# Patient Record
Sex: Male | Born: 1935 | Race: White | Hispanic: No | Marital: Married | Smoking: Never smoker
Health system: Southern US, Community
[De-identification: ages and names within clinical notes are randomized; demographics above are authoritative.]

## PROBLEM LIST (undated history)

## (undated) DIAGNOSIS — I519 Heart disease, unspecified: Secondary | ICD-10-CM

## (undated) DIAGNOSIS — I639 Cerebral infarction, unspecified: Secondary | ICD-10-CM

## (undated) DIAGNOSIS — H269 Unspecified cataract: Secondary | ICD-10-CM

## (undated) DIAGNOSIS — I10 Essential (primary) hypertension: Secondary | ICD-10-CM

## (undated) DIAGNOSIS — H3530 Unspecified macular degeneration: Secondary | ICD-10-CM

## (undated) DIAGNOSIS — I219 Acute myocardial infarction, unspecified: Secondary | ICD-10-CM

## (undated) DIAGNOSIS — C61 Malignant neoplasm of prostate: Secondary | ICD-10-CM

## (undated) DIAGNOSIS — M1990 Unspecified osteoarthritis, unspecified site: Secondary | ICD-10-CM

## (undated) DIAGNOSIS — Z96659 Presence of unspecified artificial knee joint: Secondary | ICD-10-CM

## (undated) DIAGNOSIS — I499 Cardiac arrhythmia, unspecified: Secondary | ICD-10-CM

## (undated) DIAGNOSIS — E7800 Pure hypercholesterolemia, unspecified: Secondary | ICD-10-CM

## (undated) HISTORY — PX: EYE SURGERY: SHX253

## (undated) HISTORY — DX: Unspecified osteoarthritis, unspecified site: M19.90

## (undated) HISTORY — DX: Heart disease, unspecified: I51.9

## (undated) HISTORY — DX: Cerebral infarction, unspecified: I63.9

## (undated) HISTORY — DX: Unspecified macular degeneration: H35.30

## (undated) HISTORY — DX: Malignant neoplasm of prostate: C61

## (undated) HISTORY — DX: Presence of unspecified artificial knee joint: Z96.659

## (undated) HISTORY — DX: Cardiac arrhythmia, unspecified: I49.9

## (undated) HISTORY — DX: Unspecified cataract: H26.9

---

## 1998-02-02 HISTORY — PX: CATARACT EXTRACTION: SUR2

## 2010-09-27 ENCOUNTER — Emergency Department (HOSPITAL_COMMUNITY): Payer: Medicare Other

## 2010-09-27 ENCOUNTER — Emergency Department (HOSPITAL_COMMUNITY)
Admission: EM | Admit: 2010-09-27 | Discharge: 2010-09-28 | Disposition: A | Discharge status: Home / Self Care | Payer: Medicare Other | Attending: Emergency Medicine | Admitting: Emergency Medicine

## 2010-09-27 DIAGNOSIS — I4510 Unspecified right bundle-branch block: Secondary | ICD-10-CM | POA: Insufficient documentation

## 2010-09-27 DIAGNOSIS — I2510 Atherosclerotic heart disease of native coronary artery without angina pectoris: Secondary | ICD-10-CM | POA: Insufficient documentation

## 2010-09-27 DIAGNOSIS — E7800 Pure hypercholesterolemia, unspecified: Secondary | ICD-10-CM | POA: Insufficient documentation

## 2010-09-27 DIAGNOSIS — Z951 Presence of aortocoronary bypass graft: Secondary | ICD-10-CM | POA: Insufficient documentation

## 2010-09-27 DIAGNOSIS — J4489 Other specified chronic obstructive pulmonary disease: Secondary | ICD-10-CM | POA: Insufficient documentation

## 2010-09-27 DIAGNOSIS — I252 Old myocardial infarction: Secondary | ICD-10-CM | POA: Insufficient documentation

## 2010-09-27 DIAGNOSIS — R51 Headache: Secondary | ICD-10-CM | POA: Insufficient documentation

## 2010-09-27 DIAGNOSIS — R42 Dizziness and giddiness: Secondary | ICD-10-CM | POA: Insufficient documentation

## 2010-09-27 DIAGNOSIS — J449 Chronic obstructive pulmonary disease, unspecified: Secondary | ICD-10-CM | POA: Insufficient documentation

## 2010-09-27 LAB — DIFFERENTIAL
Basophils Absolute: 0 10*3/uL (ref 0.0–0.1)
Basophils Relative: 0 % (ref 0–1)
Lymphocytes Relative: 16 % (ref 12–46)
Neutro Abs: 5.1 10*3/uL (ref 1.7–7.7)
Neutrophils Relative %: 68 % (ref 43–77)

## 2010-09-27 LAB — CBC
HCT: 42.7 % (ref 39.0–52.0)
Hemoglobin: 14.5 g/dL (ref 13.0–17.0)
RBC: 4.49 MIL/uL (ref 4.22–5.81)
WBC: 7.6 10*3/uL (ref 4.0–10.5)

## 2010-09-27 LAB — CK TOTAL AND CKMB (NOT AT ARMC)
CK, MB: 3.9 ng/mL (ref 0.3–4.0)
Relative Index: INVALID (ref 0.0–2.5)
Total CK: 72 U/L (ref 7–232)

## 2010-09-27 LAB — URINALYSIS, ROUTINE W REFLEX MICROSCOPIC
Glucose, UA: NEGATIVE mg/dL
Hgb urine dipstick: NEGATIVE
Ketones, ur: NEGATIVE mg/dL
Leukocytes, UA: NEGATIVE
pH: 7 (ref 5.0–8.0)

## 2010-09-27 LAB — COMPREHENSIVE METABOLIC PANEL
ALT: 16 U/L (ref 0–53)
AST: 18 U/L (ref 0–37)
Albumin: 3.7 g/dL (ref 3.5–5.2)
Alkaline Phosphatase: 78 U/L (ref 39–117)
BUN: 14 mg/dL (ref 6–23)
Chloride: 105 mEq/L (ref 96–112)
Potassium: 4.7 mEq/L (ref 3.5–5.1)
Sodium: 140 mEq/L (ref 135–145)
Total Protein: 7.2 g/dL (ref 6.0–8.3)

## 2010-09-27 LAB — PROTIME-INR
INR: 1.07 (ref 0.00–1.49)
Prothrombin Time: 14.1 seconds (ref 11.6–15.2)

## 2010-09-27 LAB — GLUCOSE, CAPILLARY: Glucose-Capillary: 98 mg/dL (ref 70–99)

## 2013-04-02 IMAGING — CT CT HEAD W/O CM
1 of 2 series · 16 of 30 positions shown, 20 images · non-contrast
Comparison: None

CLINICAL DATA: Dizziness and confusion

CT HEAD WITHOUT CONTRAST
TECHNIQUE: Contiguous axial images were obtained from the base of
the skull through the vertex without contrast.

[Series 3: recon 2: brain · axial · 0.47mm/px · z∈[+31,+179]mm · 16 of 64 slices shown, 20 images]
[im 4/64  brain]
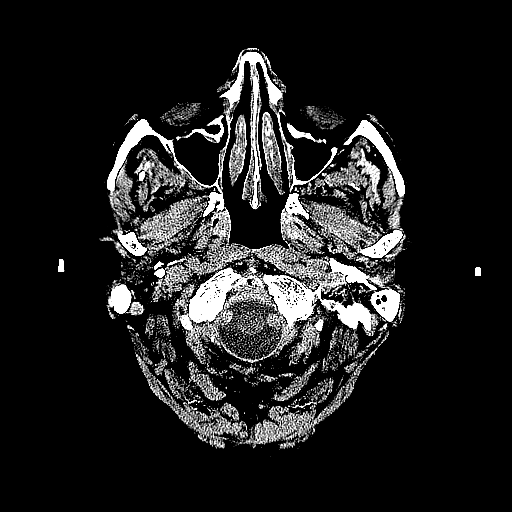
[im 4/64  bone]
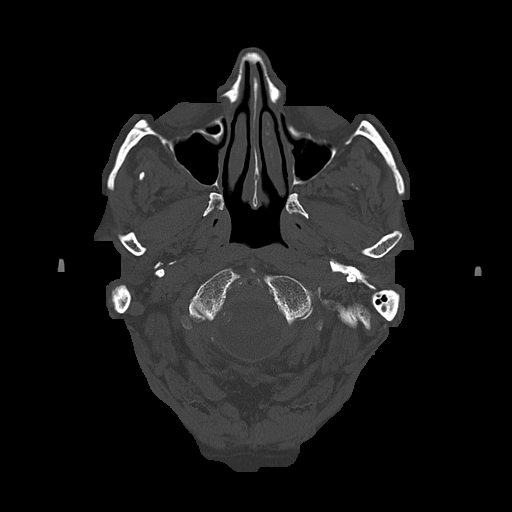
[im 7/64  brain]
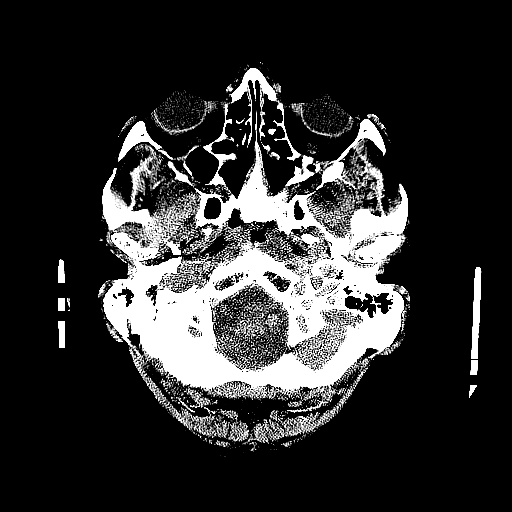
[im 10/64  brain]
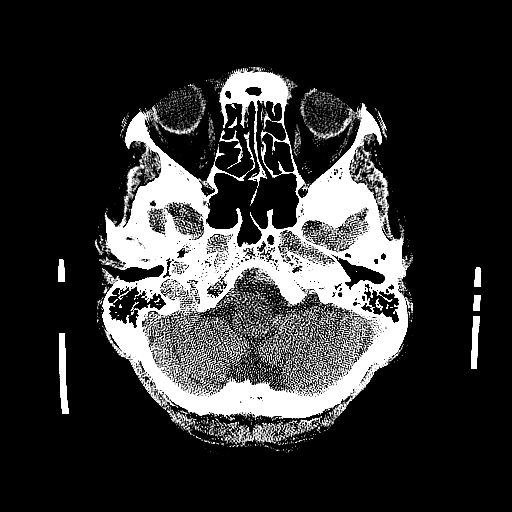
[im 14/64  brain]
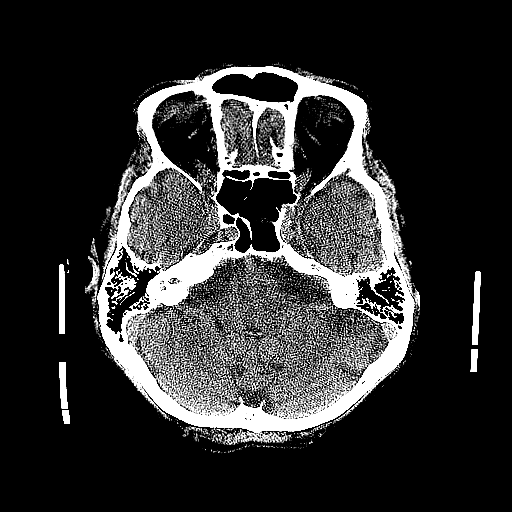
[im 20/64  brain]
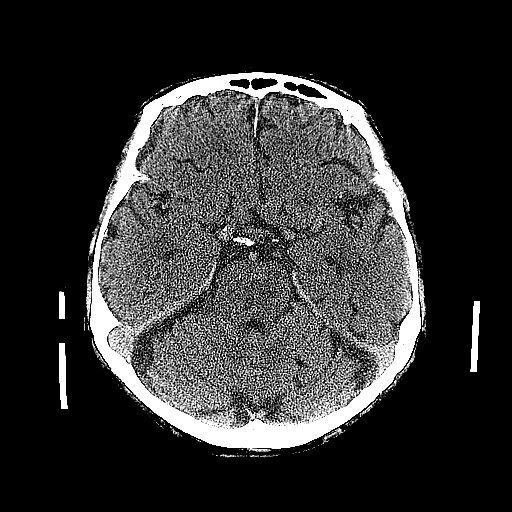
[im 20/64  bone]
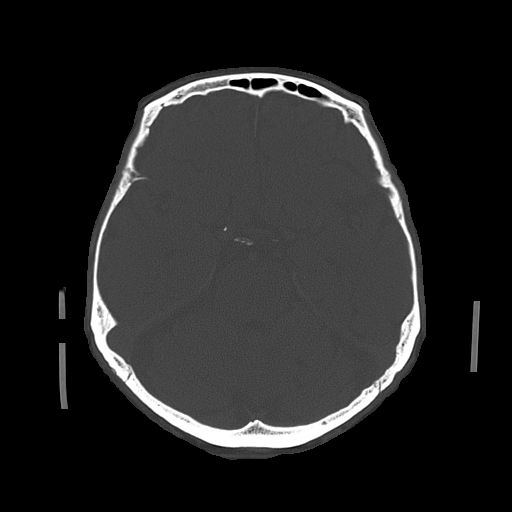
[im 24/64  brain]
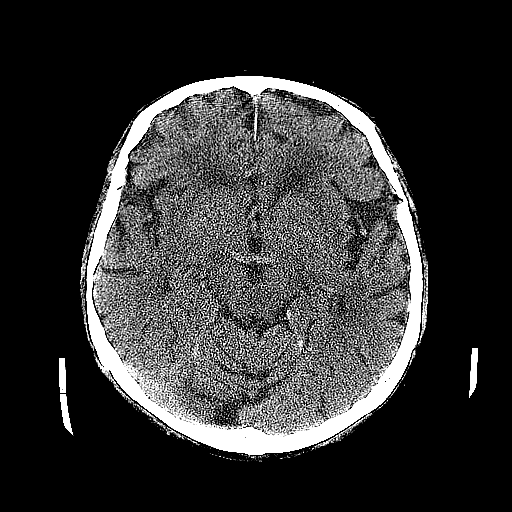
[im 27/64  brain]
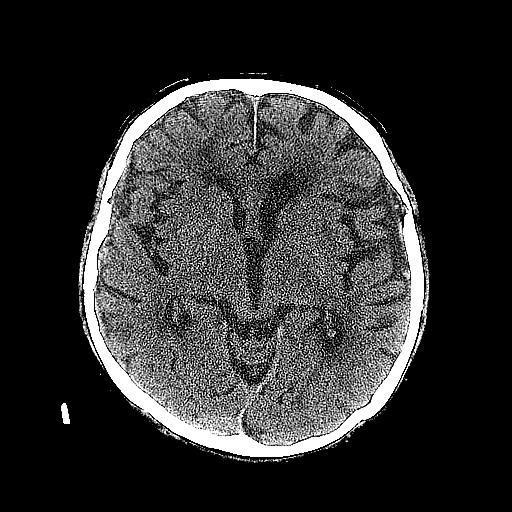
[im 30/64  brain]
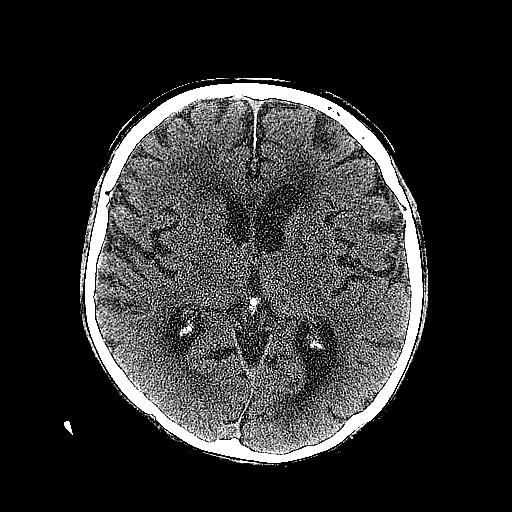
[im 34/64  brain]
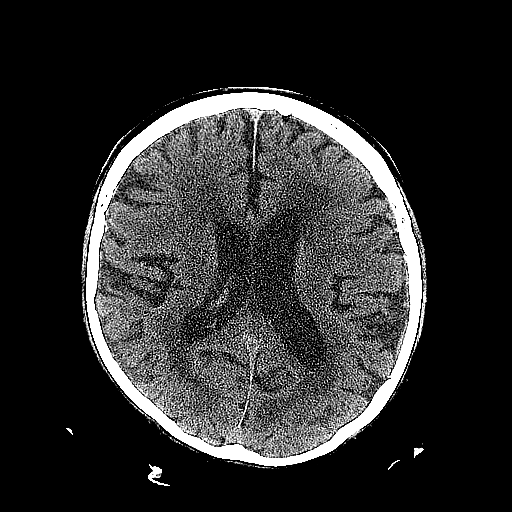
[im 34/64  bone]
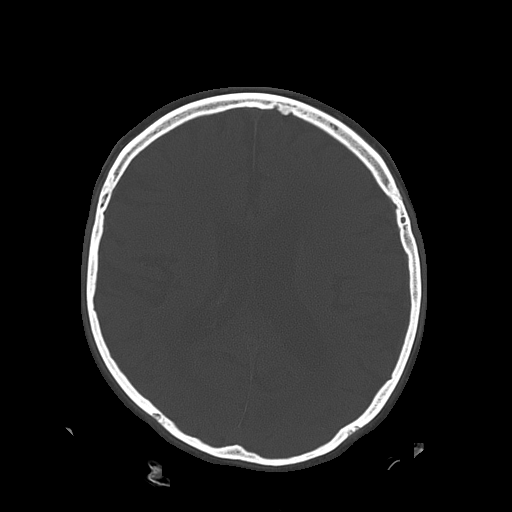
[im 37/64  brain]
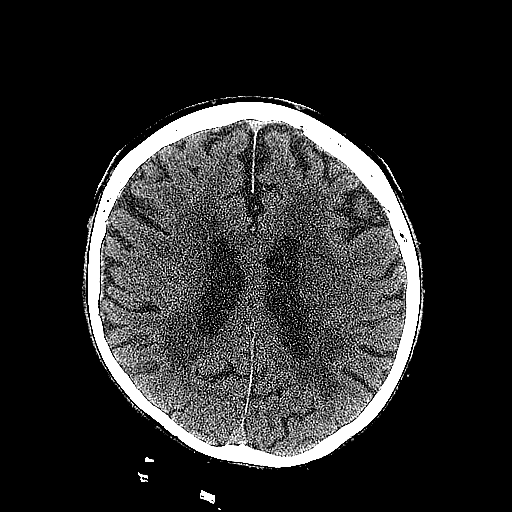
[im 40/64  brain]
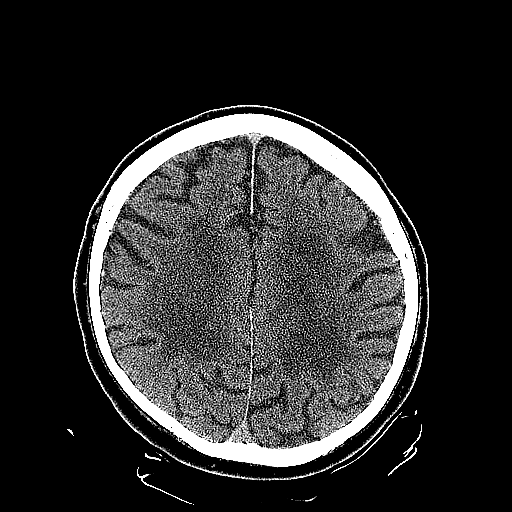
[im 44/64  brain]
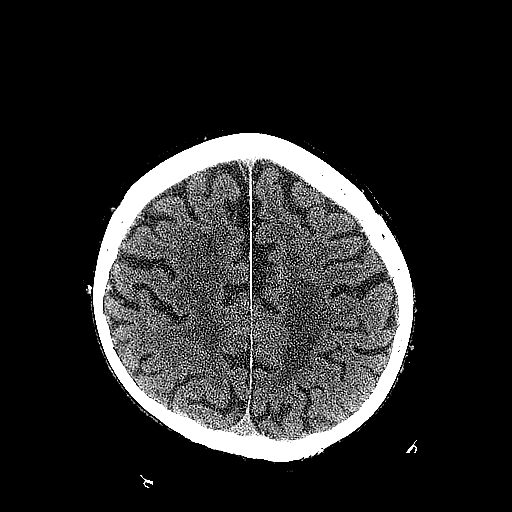
[im 50/64  brain]
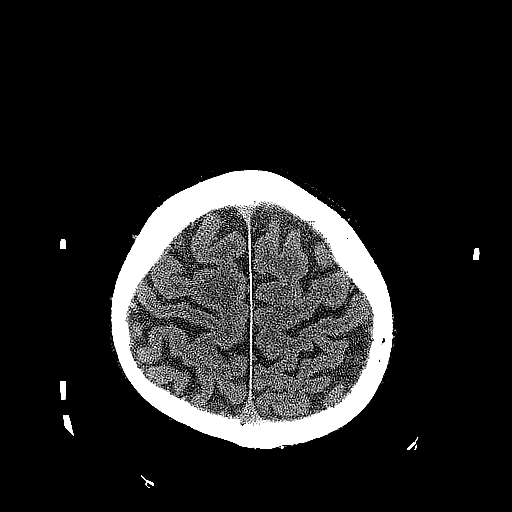
[im 50/64  bone]
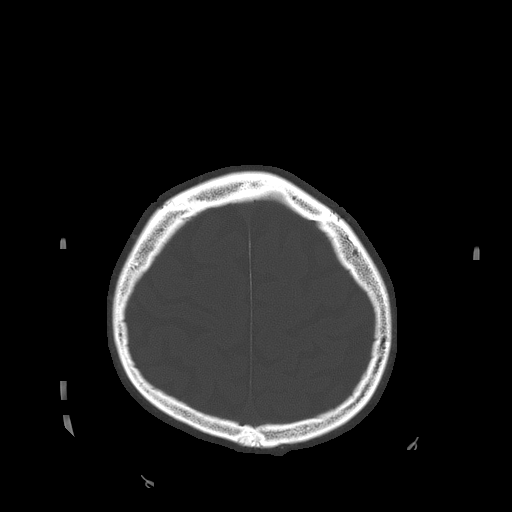
[im 54/64  brain]
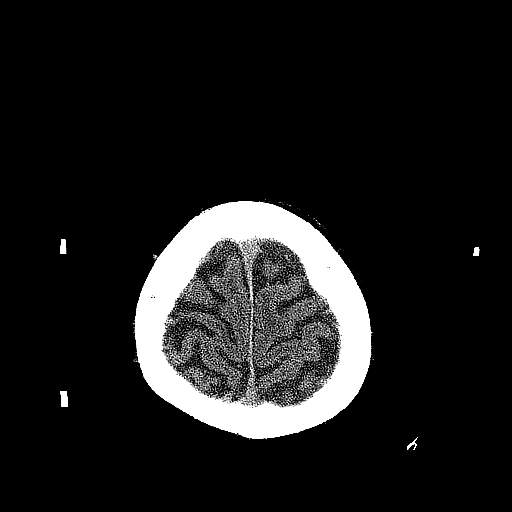
[im 57/64  brain]
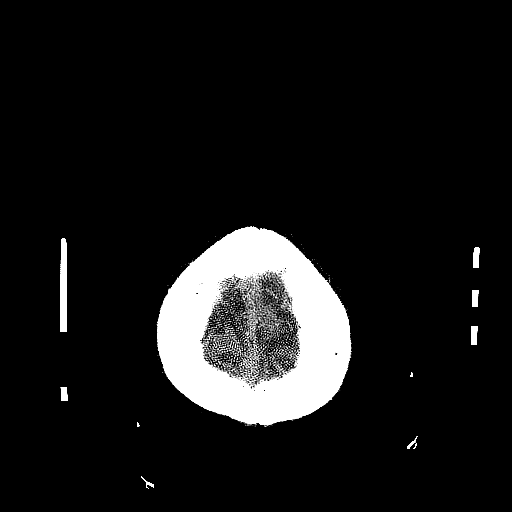
[im 60/64  brain]
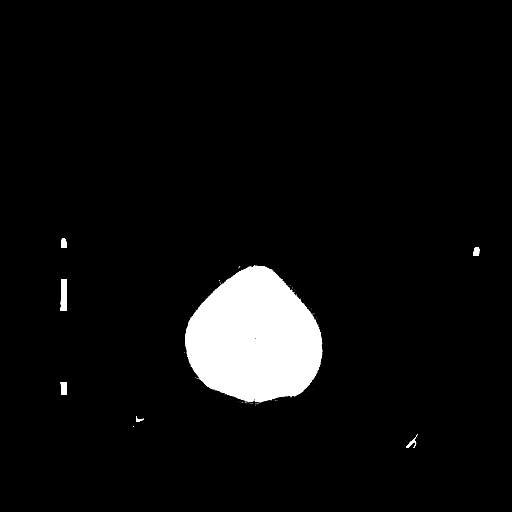

[16 of 30 positions shown; findings below may reference images not displayed]

FINDINGS: Generalized atrophy.  Moderate to advanced chronic
ischemic changes in the white matter.  No acute infarct.  Negative
for hemorrhage or mass.  Small chronic infarcts in the left
cerebellum.  Calvarium is intact.
IMPRESSION: Chronic ischemic changes.  No acute abnormality.

## 2013-10-07 ENCOUNTER — Encounter: Payer: Self-pay | Admitting: Emergency Medicine

## 2013-10-07 ENCOUNTER — Emergency Department (INDEPENDENT_AMBULATORY_CARE_PROVIDER_SITE_OTHER)
Admission: EM | Admit: 2013-10-07 | Discharge: 2013-10-07 | Disposition: A | Discharge status: Home / Self Care | Payer: Medicare Other | Source: Home / Self Care | Attending: Emergency Medicine | Admitting: Emergency Medicine

## 2013-10-07 ENCOUNTER — Emergency Department (INDEPENDENT_AMBULATORY_CARE_PROVIDER_SITE_OTHER): Payer: Medicare Other

## 2013-10-07 DIAGNOSIS — S50811A Abrasion of right forearm, initial encounter: Secondary | ICD-10-CM

## 2013-10-07 DIAGNOSIS — M25539 Pain in unspecified wrist: Secondary | ICD-10-CM

## 2013-10-07 DIAGNOSIS — IMO0002 Reserved for concepts with insufficient information to code with codable children: Secondary | ICD-10-CM

## 2013-10-07 DIAGNOSIS — M19049 Primary osteoarthritis, unspecified hand: Secondary | ICD-10-CM

## 2013-10-07 DIAGNOSIS — S5010XA Contusion of unspecified forearm, initial encounter: Secondary | ICD-10-CM

## 2013-10-07 DIAGNOSIS — S5011XA Contusion of right forearm, initial encounter: Secondary | ICD-10-CM

## 2013-10-07 HISTORY — DX: Acute myocardial infarction, unspecified: I21.9

## 2013-10-07 HISTORY — DX: Essential (primary) hypertension: I10

## 2013-10-07 HISTORY — DX: Pure hypercholesterolemia, unspecified: E78.00

## 2013-10-07 MED ORDER — ASPIRIN 81 MG PO TBEC: 81.0000 mg | DELAYED_RELEASE_TABLET | Freq: Every day | ORAL | Status: AC

## 2013-10-07 NOTE — ED Notes (Signed)
PT fell this morning and landed on his R arm.  Has lacerations on forearm that he had wrapped at the clinic at his living facility.  There is a large lump at the site that is painful.  2/10 pain right now.

## 2013-10-07 NOTE — ED Provider Notes (Signed)
CSN: [id number]     Arrival date & time 10/07/13  1403 History   First MD Initiated Contact with Patient 10/07/13 1422     Chief Complaint  Patient presents with  . Arm Injury    R   (Consider location/radiation/quality/duration/timing/severity/associated sxs/prior Treatment) Patient is a 80 y.o. male presenting with arm injury. The history is provided by the patient. No language interpreter was used.  Arm Injury Location:  Arm Injury: no   Arm location:  R forearm Pain details:    Quality:  Aching   Severity:  Mild   Onset quality:  Sudden   Timing:  Constant   Progression:  Unchanged Chronicity:  New Dislocation: no   Foreign body present:  No foreign bodies Tetanus status:  Up to date Relieved by:  Nothing Worsened by:  Nothing tried Ineffective treatments:  None tried Risk factors: no concern for non-accidental trauma   Pt fell this am and hit his right arm.   Pt has a knot on forearm and superficial skin tears.  Pt was sent here for   Past Medical History  Diagnosis Date  . Hypertension   . High cholesterol   . Heart attack    History reviewed. No pertinent past surgical history. History reviewed. No pertinent family history. History  Substance Use Topics  . Smoking status: Never Smoker   . Smokeless tobacco: Never Used  . Alcohol Use: Yes    Review of Systems  Skin: Positive for wound.  All other systems reviewed and are negative.   Allergies  Review of patient's allergies indicates no known allergies.  Home Medications   Prior to Admission medications   Medication Sig Start Date End Date Taking? Authorizing Provider  aspirin (ASPIRIN EC) 81 MG EC tablet Take 81 mg by mouth daily. Swallow whole.   Yes Historical Provider, MD  clopidogrel (PLAVIX) 75 MG tablet Take 75 mg by mouth daily.   Yes Historical Provider, MD  docusate sodium (COLACE) 100 MG capsule Take 100 mg by mouth 2 (two) times daily.   Yes Historical Provider, MD  lisinopril  (PRINIVIL,ZESTRIL) 2.5 MG tablet Take 2.5 mg by mouth daily.   Yes Historical Provider, MD  metoprolol succinate (TOPROL-XL) 25 MG 24 hr tablet Take 25 mg by mouth daily.   Yes Historical Provider, MD  Multiple Vitamins-Minerals (CENTRUM SILVER PO) Take by mouth.   Yes Historical Provider, MD  omega-3 fish oil (MAXEPA) 1000 MG CAPS capsule Take by mouth.   Yes Historical Provider, MD  omeprazole (PRILOSEC) 20 MG capsule Take 20 mg by mouth daily.   Yes Historical Provider, MD  polyethylene glycol (MIRALAX / GLYCOLAX) packet Take 17 g by mouth daily.   Yes Historical Provider, MD  simvastatin (ZOCOR) 40 MG tablet Take 40 mg by mouth daily.   Yes Historical Provider, MD   BP 157/74  Pulse 79  Temp(Src) 97.5 F (36.4 C) (Oral)  Ht 5\' 9"  (1.753 m)  Wt 183 lb 8 oz (83.235 kg)  BMI 27.09 kg/m2  SpO2 92% Physical Exam  Nursing note and vitals reviewed. Constitutional: He is oriented to person, place, and time. He appears well-developed and well-nourished.  HENT:  Head: Normocephalic.  Eyes: EOM are normal.  Neck: Normal range of motion.  Pulmonary/Chest: Effort normal.  Abdominal: He exhibits no distension.  Musculoskeletal: Normal range of motion.  Neurological: He is alert and oriented to person, place, and time.  Skin:  Hematoma right forearm,  Abrasions forearm,  No gapping  nv  and ns intact  Psychiatric: He has a normal mood and affect.    ED Course  Procedures (including critical care time) Labs Review Labs Reviewed - No data to display  Imaging Review No results found.   MDM   1. Abrasion of forearm, right, initial encounter   2. Traumatic hematoma of right forearm, initial encounter    Nurse bandaged area,   Pt counseled on hematomas and skin tears.   Pt will follow up in 28 hours for wound recheck with assisted living nurse    Fransico Meadow, PA-C 10/07/13 1705

## 2013-10-12 NOTE — ED Provider Notes (Signed)
Medical history/examination/treatment/procedure(s) were performed by non-physician provider and as supervising physician I was immediately available for consultation/collaboration.  Jacqulyn Cane, MD 10/12/13 1044

## 2014-01-23 ENCOUNTER — Emergency Department
Admission: EM | Admit: 2014-01-23 | Discharge: 2014-01-23 | Disposition: A | Discharge status: Home / Self Care | Payer: Medicare Other | Source: Home / Self Care | Attending: Family Medicine | Admitting: Family Medicine

## 2014-01-23 DIAGNOSIS — R000 Tachycardia, unspecified: Secondary | ICD-10-CM

## 2014-01-23 LAB — POCT CBC W AUTO DIFF (K'VILLE URGENT CARE)

## 2014-01-23 NOTE — ED Notes (Signed)
Patient complains of increased heart rate, started this evening

## 2014-01-23 NOTE — Discharge Instructions (Signed)
Continue present medications. If symptoms become significantly worse during the night or over the weekend, proceed to the local emergency room.

## 2014-01-23 NOTE — ED Provider Notes (Signed)
CSN: [id number]     Arrival date & time 01/23/14  1904 History   First MD Initiated Contact with Patient 01/23/14 1920     Chief Complaint  Patient presents with  . Tachycardia      HPI Comments: Patient complains of being aware of increased heart rate this evening without chest pain, light-headedness, or shortness of breath.  He states that he had a cardiac stress test one week ago, and the sensation of rapid heart rate today was similar to that he had during his stress test. He (and his wife) report that three days ago he had an episode of garbled speech that lasted about an hour (he knew what he wanted to say but his speech was not clear) associated with some vague tingling in his right hand.  His symptoms resolved completely, but the next morning he had similar garbled speech for about five minutes.  He has had no recurrent neurologic symptoms and now feels well. Past medical history of MI two years ago, and triple bypass one year ago.  He had left mild strokes (with right side weakness) in 2012 and 2013 with minimal residual deficits.  His wife states that his last carotid doppler study was probably about 6 years ago.  Patient is a 80 y.o. male presenting with palpitations. The history is provided by the patient and the spouse.  Palpitations Palpitations quality:  Regular Onset quality:  Sudden Duration:  2 hours Timing:  Intermittent Progression:  Waxing and waning Chronicity:  Recurrent Context: not anxiety, not bronchodilators, not caffeine, not exercise, not hyperventilation and not stimulant use   Relieved by:  Nothing Worsened by:  Nothing tried Ineffective treatments:  None tried Associated symptoms: no chest pain, no chest pressure, no cough, no diaphoresis, no dizziness, no hemoptysis, no leg pain, no lower extremity edema, no malaise/fatigue, no nausea, no near-syncope, no numbness, no orthopnea, no PND, no shortness of breath, no syncope, no vomiting and no weakness   Risk  factors: heart disease   Risk factors: no hx of atrial fibrillation     Past Medical History  Diagnosis Date  . Hypertension   . High cholesterol   . Heart attack    No past surgical history on file. No family history on file. History  Substance Use Topics  . Smoking status: Never Smoker   . Smokeless tobacco: Never Used  . Alcohol Use: Yes    Review of Systems  Unable to perform ROS: Patient nonverbal  Constitutional: Negative for fever, chills, malaise/fatigue, diaphoresis and fatigue.  Eyes: Negative.   Respiratory: Negative for cough, hemoptysis and shortness of breath.   Cardiovascular: Positive for palpitations. Negative for chest pain, orthopnea, syncope, PND and near-syncope.  Gastrointestinal: Negative for nausea and vomiting.  Genitourinary: Negative.   Neurological: Positive for speech difficulty and headaches. Negative for dizziness, seizures, syncope, facial asymmetry, weakness, light-headedness and numbness.  All other systems reviewed and are negative.   Allergies  Review of patient's allergies indicates no known allergies.  Home Medications   Prior to Admission medications   Medication Sig Start Date End Date Taking? Authorizing Provider  acetaminophen (TYLENOL) 500 MG tablet Take 500 mg by mouth every 6 (six) hours as needed.   Yes Historical Provider, MD  aspirin (ASPIRIN EC) 81 MG EC tablet Take 1 tablet (81 mg total) by mouth daily. Swallow whole. 10/07/13  Yes Hollace Kinnier Sofia, PA-C  clopidogrel (PLAVIX) 75 MG tablet Take 75 mg by mouth daily.   Yes Historical  Provider, MD  docusate sodium (COLACE) 100 MG capsule Take 100 mg by mouth 2 (two) times daily.   Yes Historical Provider, MD  lisinopril-hydrochlorothiazide (PRINZIDE,ZESTORETIC) 10-12.5 MG per tablet Take 1 tablet by mouth daily.   Yes Historical Provider, MD  metoprolol succinate (TOPROL-XL) 25 MG 24 hr tablet Take 25 mg by mouth daily.   Yes Historical Provider, MD  Multiple Vitamins-Minerals  (CENTRUM SILVER PO) Take by mouth.   Yes Historical Provider, MD  omega-3 fish oil (MAXEPA) 1000 MG CAPS capsule Take by mouth.   Yes Historical Provider, MD  pantoprazole (PROTONIX) 40 MG tablet Take 40 mg by mouth daily.   Yes Historical Provider, MD  polyethylene glycol (MIRALAX / GLYCOLAX) packet Take 17 g by mouth daily.   Yes Historical Provider, MD  simvastatin (ZOCOR) 40 MG tablet Take 40 mg by mouth daily.   Yes Historical Provider, MD   BP 162/92 mmHg  Pulse 96  Temp(Src) 97.9 F (36.6 C) (Oral)  SpO2 98% Physical Exam Nursing notes and Vital Signs reviewed. Appearance:  Patient appears healthy, stated age, and in no acute distress.  He has a hearing deficit but is alert and oriented.  Eyes:  Pupils are equal, round, and reactive to light and accomodation.  Extraocular movement is intact.  Conjunctivae are not inflamed   Nose:  Normal Mouth/Pharynx:  Tongue midline Neck:  Supple.  No adenopathy.  Carotids have decreased upstrokes; no bruits heard  Lungs:  Clear to auscultation.  Breath sounds are equal.  Heart:  Regular rate and rhythm without murmurs, rubs, or gallops.  Abdomen:  Nontender without masses or hepatosplenomegaly.  Bowel sounds are present.  No CVA or flank tenderness.  Extremities:  No edema.  No calf tenderness Skin:  No rash present.  Neurologic:  Cranial nerves 2 through 12 are normal.  Patellar reflexes are normal; achilles reflexes not elicited.  Cerebellar function is intact (finger-to-nose and rapid alternating hand movement).  Gait and station are normal.  Grip strength symmetric bilaterally.  .    19:43:27 Orthostatic Vital Signs DH  Orthostatic Lying  - BP- Lying: 145/80 mmHg ; Pulse- Lying: 113  Orthostatic Sitting - BP- Sitting: 150/97 mmHg ; Pulse- Sitting: 112  Orthostatic Standing at 0 minutes - BP- Standing at 0 minutes: 137/87 mmHg ; Pulse- Standing at 0 minutes: 102    ED Course  Procedures  none    Labs Reviewed  POCT CBC W AUTO DIFF  ([facility]):  WBC 9.2; LY 24.3; MO 6.1; GR 69.6; Hgb 16.0; Platelets 216    EKG: Rate:   98 BPM PR:  192 msec QT:  356 msec QTcH:  422 msec QRSD:  132 msec QRS axis:  -7 Degrees Interpretation:   Normal sinus rhythm; right bundle branch block; no acute changes; no significant change from 2012      MDM   1. Tachycardia; note no significant change in EKG from 2012.  Normal neurologic exam today reassuring.    Continue present medications (note that patient is on appropriate dose of Plavix and aspirin for thrombotic event prevention).  Followup with PCP as soon as possible.  Recent dysarthria suggests TIA.  Consider repeat carotid ultrasound. If symptoms become significantly worse during the night or over the weekend, proceed to the local emergency room.     Kandra Nicolas, MD 01/29/14 [phone]

## 2014-01-24 LAB — POCT CBC W AUTO DIFF (K'VILLE URGENT CARE)

## 2014-02-02 ENCOUNTER — Telehealth: Payer: Self-pay | Admitting: *Deleted

## 2016-04-12 IMAGING — CR DG FINGER THUMB 2+V*R*
2 series · 2 of 2 positions shown · non-contrast
Comparison: None.

CLINICAL DATA: Right forearm and thumb pain after fall today.

EXAM:
RIGHT THUMB 2+V

[view not recorded (1 of 2)]
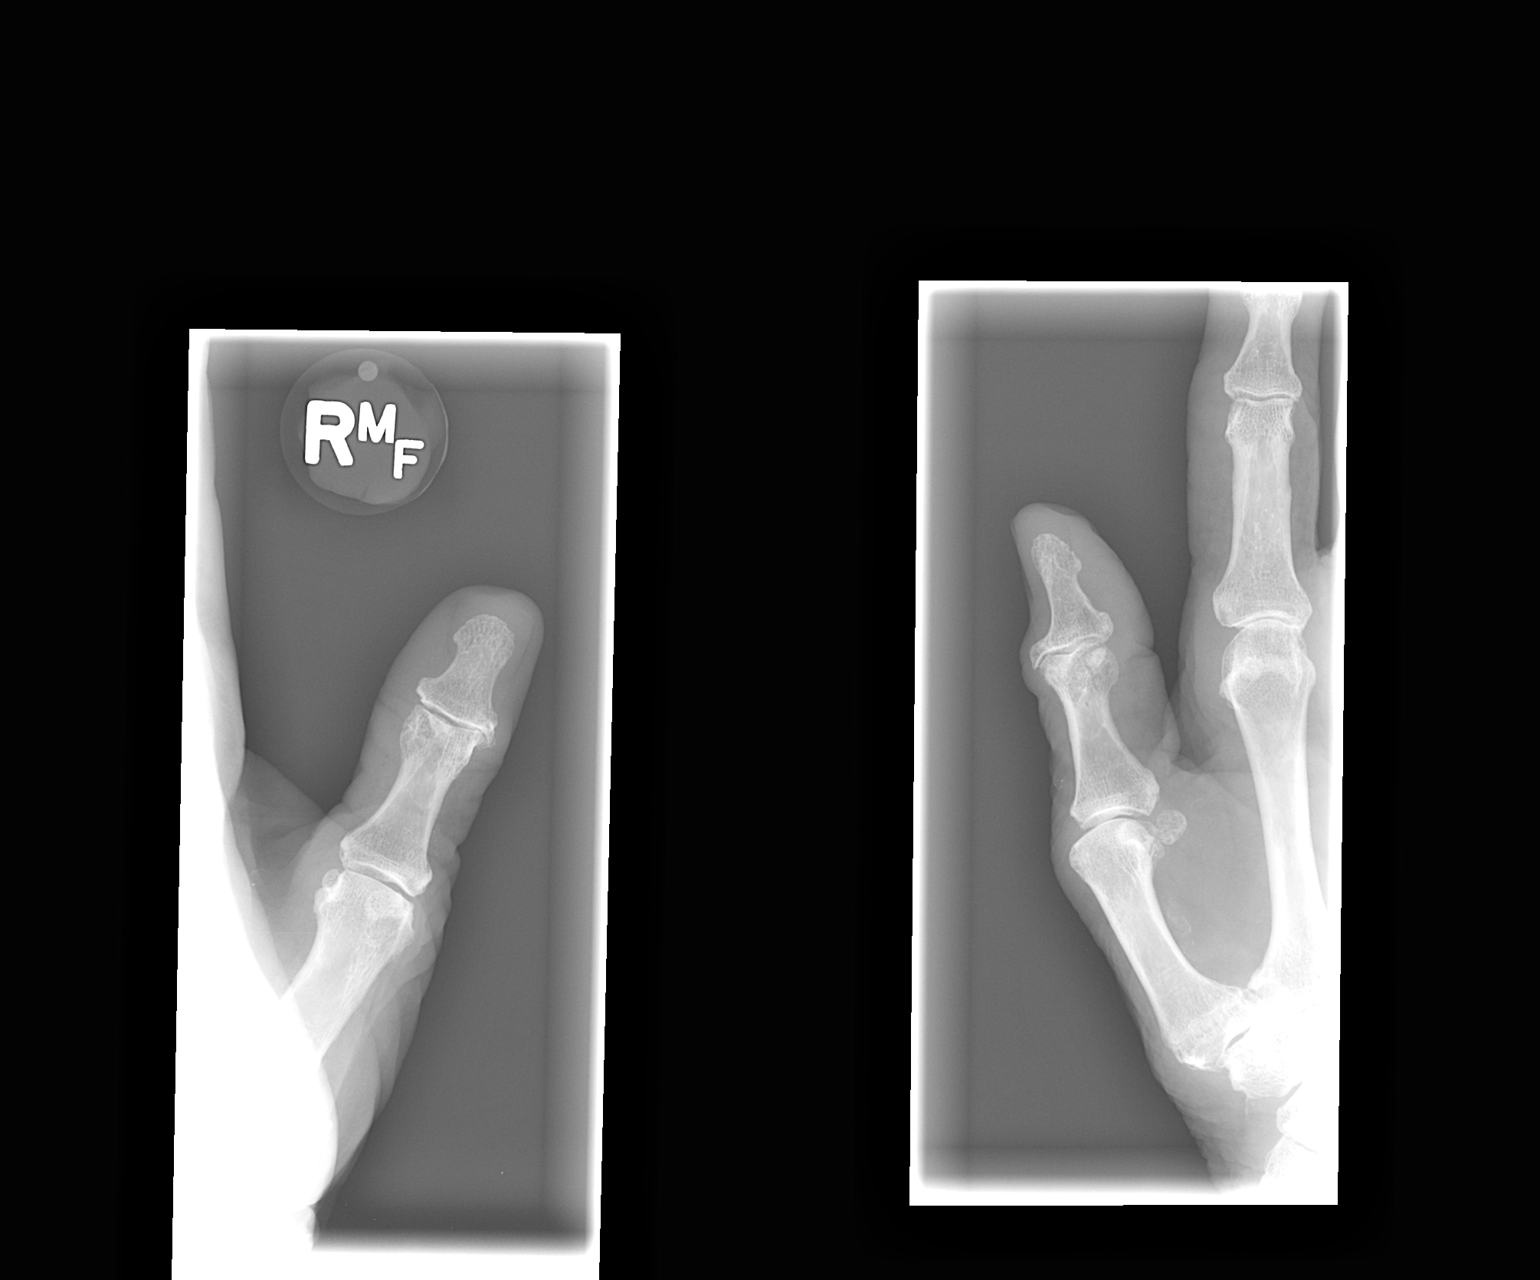

[view not recorded (2 of 2)]
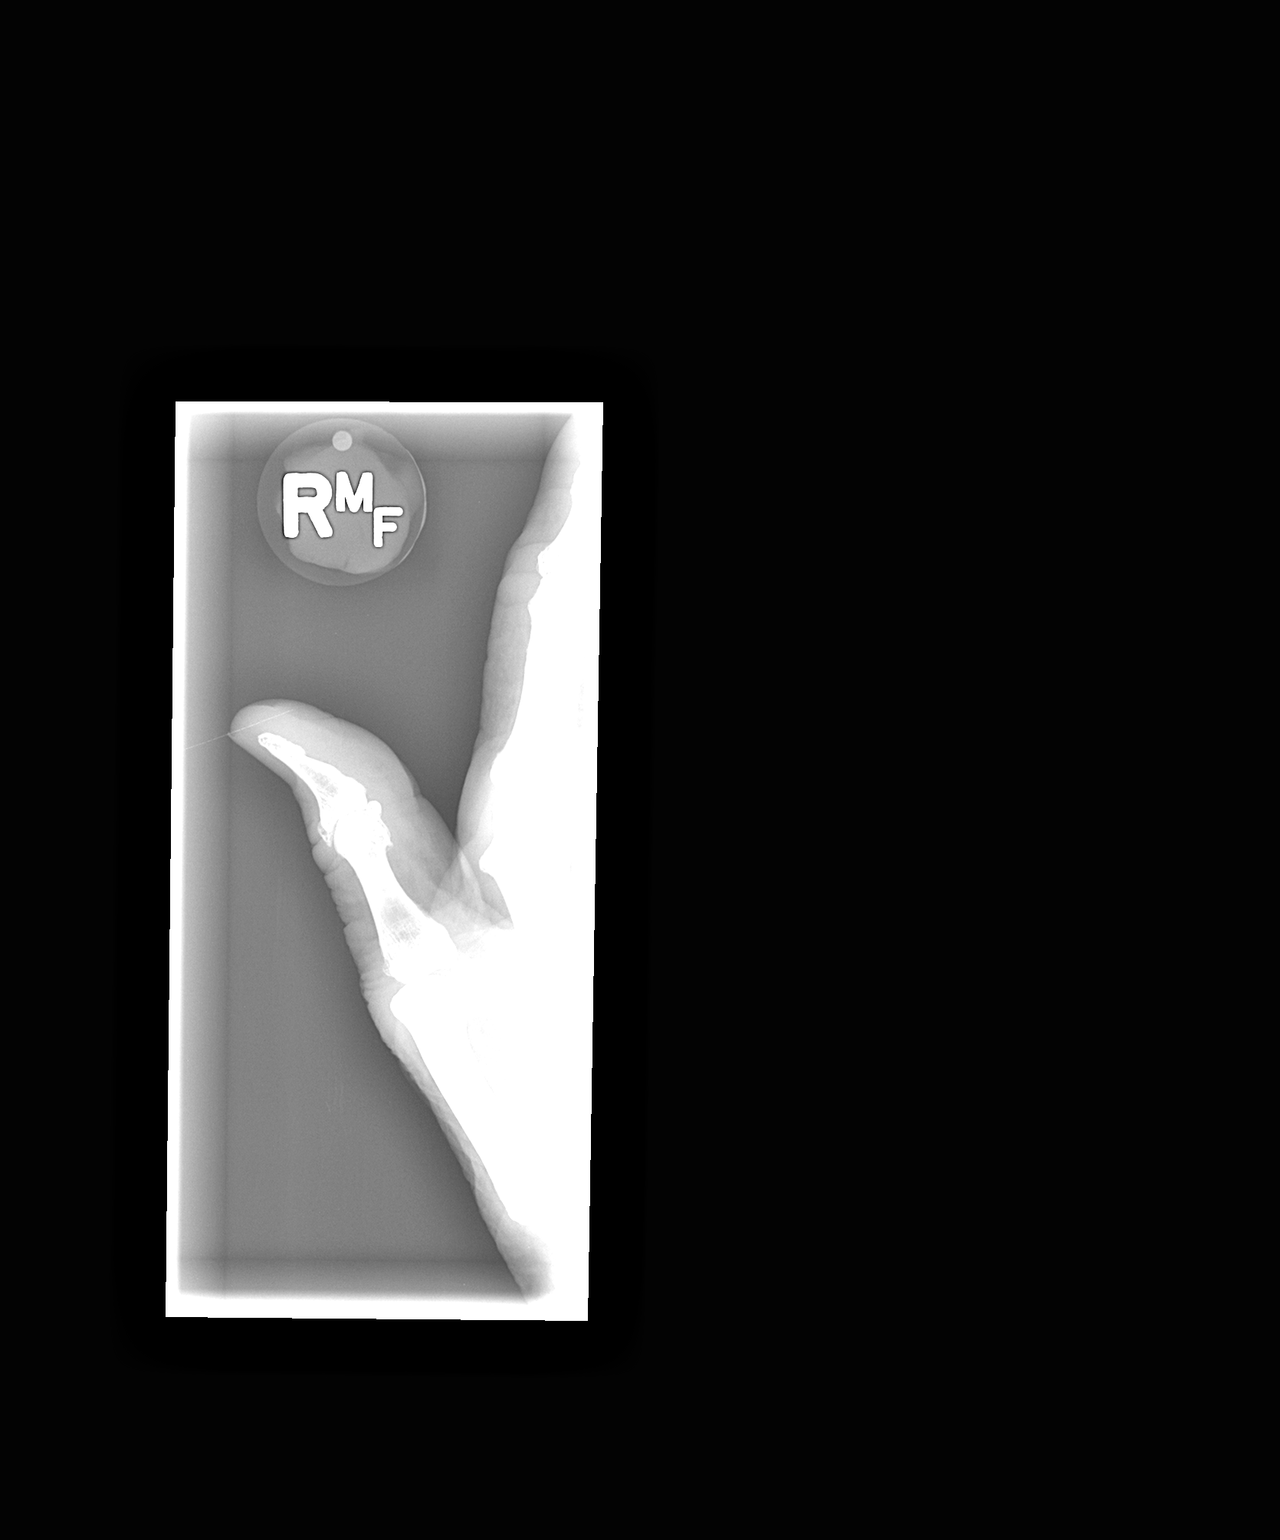

[2 of 2 positions shown; findings below may reference images not displayed]

FINDINGS: No acute fracture or dislocation is identified. There is mild joint
space narrowing and moderate osteophytosis at the thumb
interphalangeal and first carpometacarpal joints. No lytic or
blastic osseous lesion is identified. Faint soft tissue
calcification is noted adjacent to the shaft of the first
metacarpal.
IMPRESSION: 1. No acute osseous abnormality.
2. Thumb IP and first CMC joint osteoarthrosis.

## 2016-11-23 NOTE — Progress Notes (Signed)
[facility] Note  11/24/2016     CHIEF COMPLAINT Patient presents for Retina Evaluation   HISTORY OF PRESENT ILLNESS: Billy Moody is a 83 y.o. male who presents to the clinic today for:   HPI    Retina Evaluation  In both eyes.  This started 2 days ago.  Associated Symptoms Floaters.  Negative for Pain, Glare, Distortion, Redness, Trauma, Shoulder/Hip pain, Fatigue, Weight Loss, Jaw Claudication, Flashes, Blind Spot, Photophobia, Scalp Tenderness and Fever.  Context:  distance vision, mid-range vision and near vision.  Treatments tried include eye drops.  Response to treatment was no improvement.  I, the attending physician,  performed the HPI with the patient and updated documentation appropriately.        Comments  Referral of Dr. Bing Plume Gwenlyn Perking PA Eval. Retinal Hem OS . Patient states he was seeing red dots and spots in left eye last week lasting 24 hrs. Denies pain, flashes and blind spots. He states he has had floaters for years.  He is using erythromycin OINT @ hs and uses systane gtts QID, eye lubricate @ hS . He is taking multivitamin QD   Pt states that he is seeing "a bunch of tiny red spots, several hundred, in a narrow vertical band"; Pt states that it lasted x 24 hours; Pt reports that he has trouble telling which eye the red spots were in; Pt states that the most noticeable characteristics were that all the spots were the same size, and there were very narrow lines all the same size; Pt states this episode happened x 1 week ago; Pt denies any other eye sx other than cataract sx;      Last edited by Bernarda Caffey, MD on 11/24/2016  2:02 PM. (History)      Referring physician: Calvert Cantor, MD [address] [place], [place] [address]  HISTORICAL INFORMATION:   Selected notes from the MEDICAL RECORD NUMBER Referral from Shirleen Schirmer, PA-C for concern of retinal hemorrhage and R/O RT OS;  Ocular Hx- pseudophakia OU (2007 in  [place]), hx of diplopia  PMH- crohns, CAD, HTN, hx of stroke, hx of prostate ca, hx of skin ca   CURRENT MEDICATIONS: No current outpatient prescriptions on file. (Ophthalmic Drugs)   No current facility-administered medications for this visit.  (Ophthalmic Drugs)   Current Outpatient Prescriptions (Other)  Medication Sig  . acetaminophen (TYLENOL) 500 MG tablet Take 500 mg by mouth every 6 (six) hours as needed.  Marland Kitchen aspirin (ASPIRIN EC) 81 MG EC tablet Take 1 tablet (81 mg total) by mouth daily. Swallow whole.  . clopidogrel (PLAVIX) 75 MG tablet Take 75 mg by mouth daily.  Marland Kitchen docusate sodium (COLACE) 100 MG capsule Take 100 mg by mouth 2 (two) times daily.  Marland Kitchen lisinopril-hydrochlorothiazide (PRINZIDE,ZESTORETIC) 10-12.5 MG per tablet Take 1 tablet by mouth daily.  . metoprolol succinate (TOPROL-XL) 25 MG 24 hr tablet Take 25 mg by mouth daily.  . Multiple Vitamins-Minerals (CENTRUM SILVER PO) Take by mouth.  . Multiple Vitamins-Minerals (MULTIVITAMIN ADULT PO) Take by mouth.  . omega-3 fish oil (MAXEPA) 1000 MG CAPS capsule Take by mouth.  . pantoprazole (PROTONIX) 40 MG tablet Take 40 mg by mouth daily.  . polyethylene glycol (MIRALAX / GLYCOLAX) packet Take 17 g by mouth daily.  . simvastatin (ZOCOR) 40 MG tablet Take 40 mg by mouth daily.   No current facility-administered medications for this visit.  (Other)      REVIEW OF  SYSTEMS: ROS    Positive for: Cardiovascular, Eyes   Negative for: Constitutional, Gastrointestinal, Neurological, Skin, Genitourinary, Musculoskeletal, HENT, Endocrine, Respiratory, Psychiatric, Allergic/Imm, Heme/Lymph   Last edited by Zenovia Jordan, LPN on 52/84/1324 40:10 PM. (History)       ALLERGIES Allergies  Allergen Reactions  . Percocet [Oxycodone-Acetaminophen] Anaphylaxis    PAST MEDICAL HISTORY Past Medical History:  Diagnosis Date  . Arthritis   . Cataract   . CVA (cerebral vascular accident) ([place])    X 2   . H/O total knee  replacement   . Heart attack ([place])   . Heart disease   . High cholesterol   . Hypertension   . Irregular heart rhythm   . Macular degeneration   . Prostate cancer [facility])    Past Surgical History:  Procedure Laterality Date  . CATARACT EXTRACTION  2000   ou  . EYE SURGERY      FAMILY HISTORY Family History  Problem Relation Age of Onset  . Stroke Mother   . Cataracts Brother     SOCIAL HISTORY Social History  Substance Use Topics  . Smoking status: Never Smoker  . Smokeless tobacco: Never Used  . Alcohol use Yes         OPHTHALMIC EXAM:  Base Eye Exam    Visual Acuity (Snellen - Linear)      Right Left   Dist cc 20/50 20/70   Dist ph cc 20/NI 20/60-3   Correction:  Glasses       Tonometry (Tonopen, 1:07 PM)      Right Left   Pressure 19 12       Pupils      Pupils Dark Light Shape React APD   Right PERRL 4 3 Round Minimal None   Left PERRL 4 3 Round Minimal None       Visual Fields (Counting fingers)      Left Right    Full Full       Extraocular Movement      Right Left    Full, Ortho Full, Ortho       Neuro/Psych    Oriented x3:  Yes   Mood/Affect:  Normal       Dilation    Both eyes:   1.0% Mydriacyl, 2.5% Phenylephrine @ 1:09 PM        Slit Lamp and Fundus Exam    Slit Lamp Exam      Right Left   Lids/Lashes Dermatochalasis - upper lid Dermatochalasis - upper lid   Conjunctiva/Sclera White and quiet White and quiet   Cornea well healed temporal wound, 1+ Punctate epithelial erosions - diffuse, greatest inferiorly  well healed temporal wound, 1+ Punctate epithelial erosions - diffuse, greatest inferiorly    Anterior Chamber Deep and quiet Deep and quiet   Iris Round and dilated Round and dilated   Lens Posterior chamber intraocular lens - in good postion, open PC Three piece Posterior chamber intraocular lens - mild lateral displacement, open PC   Vitreous Vitreous syneresis Posterior vitreous detachment       Fundus Exam       Right Left   Disc Temporal Peripapillary atrophy, otherwise normal Temporal Peripapillary atrophy   C/D Ratio 0.6 0.6   Macula Drusen, Retinal pigment epithelial mottling and clumping, no heme or edema Drusen, Retinal pigment epithelial mottling and clumping, no heme or edema   Vessels Vascular attenuation Vascular attenuation   Periphery Attached, no RT/RD, Superior-temporal chorioretinal atrophy, 360 Reticular degeneration Attached,  no RT/RD; Reticular degeneration, scattered chorioretinal atrophy         Refraction    Wearing Rx      Sphere Cylinder Axis Add   Right -3.25 +2.25 163 +2.75   Left -3.00 +5.25 013 +2.75   Age:  6 mos       Manifest Refraction      Sphere Cylinder Axis Dist VA   Right -3.00 +2.00 142 20/50 (NI)   Left -4.75 +7.50 180 20/50          IMAGING AND PROCEDURES  Imaging and Procedures for 11/24/16  OCT, Retina - OU - Both Eyes     Right Eye Quality was good. Central Foveal Thickness: 253. Progression has no prior data. Findings include normal foveal contour, epiretinal membrane, no IRF, no SRF, pigment epithelial detachment (Exaggerated foveal contour, focal ellipsoid disruption, central SRHM/PED).   Left Eye Quality was good. Central Foveal Thickness: 270. Progression has no prior data. Findings include normal foveal contour, no IRF, no SRF, epiretinal membrane (Exaggerated foveal contour, focal ellipsoid disruption, central SRHM/PED ).   Notes Images taken, stored on drive   Diagnosis / Impression:  Non-exudative ARMD OU  Clinical management:  See below  Abbreviations: NFP - Normal foveal profile. CME - cystoid macular edema. PED - pigment epithelial detachment. IRF - intraretinal fluid. SRF - subretinal fluid. EZ - ellipsoid zone. ERM - epiretinal membrane. ORA - outer retinal atrophy. ORT - outer retinal tubulation. SRHM - subretinal hyper-reflective material                ASSESSMENT/PLAN:    ICD-10-CM   1. Intermediate stage  nonexudative age-related macular degeneration of both eyes H35.3132 OCT, Retina - OU - Both Eyes  2. Pseudophakia of both eyes Z96.1   3. Visual disturbance, subjective H53.10     1. Age related macular degeneration, non-exudative, both eyes  - The incidence, anatomy, and pathology of dry AMD, risk of progression, and the AREDS and AREDS 2 study including smoking risks discussed with patient.  - Recommend amsler grid monitoring and continuation of Preservision vitamins  - f/u PRN  2. Pseudophakia OU  - s/p CE/IOL OU  - stable, doing well  - monitor  3. Subjective visual disturbance  - 24 hr episode of vertical grid of red dots  - could be related to #1 above -- nonexudative ARMD  - no retinal tears or hemorrhage noted to correlate with visual complaint  Ophthalmic Meds Ordered this visit:  No orders of the defined types were placed in this encounter.      Return if symptoms worsen or fail to improve.  There are no Patient Instructions on file for this visit.   Explained the diagnoses, plan, and follow up with the patient and they expressed understanding.  Patient expressed understanding of the importance of proper follow up care.   Gardiner Sleeper, M.D., Ph.D. Diseases & Surgery of the Retina and [employer] 11/24/16     Abbreviations: M myopia (nearsighted); A astigmatism; H hyperopia (farsighted); P presbyopia; Mrx spectacle prescription;  CTL contact lenses; OD right eye; OS left eye; OU both eyes  XT exotropia; ET esotropia; PEK punctate epithelial keratitis; PEE punctate epithelial erosions; DES dry eye syndrome; MGD meibomian gland dysfunction; ATs artificial tears; PFAT's preservative free artificial tears; [place] nuclear sclerotic cataract; PSC posterior subcapsular cataract; ERM epi-retinal membrane; PVD posterior vitreous detachment; RD retinal detachment; DM diabetes mellitus; DR diabetic retinopathy; NPDR non-proliferative diabetic  retinopathy; PDR proliferative diabetic retinopathy; CSME clinically significant macular edema; DME diabetic macular edema; dbh dot blot hemorrhages; CWS cotton wool spot; POAG primary open angle glaucoma; C/D cup-to-disc ratio; HVF humphrey visual field; GVF goldmann visual field; OCT optical coherence tomography; IOP intraocular pressure; BRVO Branch retinal vein occlusion; CRVO central retinal vein occlusion; CRAO central retinal artery occlusion; BRAO branch retinal artery occlusion; RT retinal tear; SB scleral buckle; PPV pars plana vitrectomy; VH Vitreous hemorrhage; PRP panretinal laser photocoagulation; IVK intravitreal kenalog; VMT vitreomacular traction; MH Macular hole;  NVD neovascularization of the disc; NVE neovascularization elsewhere; AREDS age related eye disease study; ARMD age related macular degeneration; POAG primary open angle glaucoma; EBMD epithelial/anterior basement membrane dystrophy; ACIOL anterior chamber intraocular lens; IOL intraocular lens; PCIOL posterior chamber intraocular lens; Phaco/IOL phacoemulsification with intraocular lens placement; [place] photorefractive keratectomy; LASIK laser assisted in situ keratomileusis; HTN hypertension; DM diabetes mellitus; COPD chronic obstructive pulmonary disease

## 2016-11-24 ENCOUNTER — Ambulatory Visit (INDEPENDENT_AMBULATORY_CARE_PROVIDER_SITE_OTHER): Payer: Medicare Other | Admitting: Ophthalmology

## 2016-11-24 ENCOUNTER — Encounter (INDEPENDENT_AMBULATORY_CARE_PROVIDER_SITE_OTHER): Payer: Self-pay | Admitting: Ophthalmology

## 2016-11-24 DIAGNOSIS — H353132 Nonexudative age-related macular degeneration, bilateral, intermediate dry stage: Secondary | ICD-10-CM | POA: Diagnosis not present

## 2016-11-24 DIAGNOSIS — Z961 Presence of intraocular lens: Secondary | ICD-10-CM

## 2016-11-24 DIAGNOSIS — H5310 Unspecified subjective visual disturbances: Secondary | ICD-10-CM | POA: Diagnosis not present

## 2017-12-17 ENCOUNTER — Emergency Department (HOSPITAL_COMMUNITY): Payer: Medicare Other

## 2017-12-17 ENCOUNTER — Other Ambulatory Visit: Payer: Self-pay

## 2017-12-17 ENCOUNTER — Encounter (HOSPITAL_COMMUNITY): Payer: Self-pay | Admitting: *Deleted

## 2017-12-17 ENCOUNTER — Emergency Department (HOSPITAL_COMMUNITY)
Admission: EM | Admit: 2017-12-17 | Discharge: 2017-12-17 | Payer: Medicare Other | Attending: Emergency Medicine | Admitting: Emergency Medicine

## 2017-12-17 DIAGNOSIS — Z79899 Other long term (current) drug therapy: Secondary | ICD-10-CM | POA: Diagnosis not present

## 2017-12-17 DIAGNOSIS — S0990XA Unspecified injury of head, initial encounter: Secondary | ICD-10-CM | POA: Diagnosis present

## 2017-12-17 DIAGNOSIS — Y939 Activity, unspecified: Secondary | ICD-10-CM | POA: Insufficient documentation

## 2017-12-17 DIAGNOSIS — W1812XA Fall from or off toilet with subsequent striking against object, initial encounter: Secondary | ICD-10-CM | POA: Diagnosis not present

## 2017-12-17 DIAGNOSIS — Z23 Encounter for immunization: Secondary | ICD-10-CM | POA: Insufficient documentation

## 2017-12-17 DIAGNOSIS — W19XXXA Unspecified fall, initial encounter: Secondary | ICD-10-CM

## 2017-12-17 DIAGNOSIS — Y92121 Bathroom in nursing home as the place of occurrence of the external cause: Secondary | ICD-10-CM | POA: Diagnosis not present

## 2017-12-17 DIAGNOSIS — Z96659 Presence of unspecified artificial knee joint: Secondary | ICD-10-CM | POA: Diagnosis not present

## 2017-12-17 DIAGNOSIS — I10 Essential (primary) hypertension: Secondary | ICD-10-CM | POA: Insufficient documentation

## 2017-12-17 DIAGNOSIS — S0101XA Laceration without foreign body of scalp, initial encounter: Secondary | ICD-10-CM | POA: Diagnosis not present

## 2017-12-17 DIAGNOSIS — Z7902 Long term (current) use of antithrombotics/antiplatelets: Secondary | ICD-10-CM | POA: Insufficient documentation

## 2017-12-17 DIAGNOSIS — I252 Old myocardial infarction: Secondary | ICD-10-CM | POA: Insufficient documentation

## 2017-12-17 DIAGNOSIS — Z7982 Long term (current) use of aspirin: Secondary | ICD-10-CM | POA: Diagnosis not present

## 2017-12-17 DIAGNOSIS — Y999 Unspecified external cause status: Secondary | ICD-10-CM | POA: Insufficient documentation

## 2017-12-17 MED ORDER — LIDOCAINE HCL (PF) 1 % IJ SOLN
INTRAMUSCULAR | Status: AC
  Administered 2017-12-17: 5 mL
  Filled 2017-12-17: qty 30

## 2017-12-17 MED ORDER — TETANUS-DIPHTH-ACELL PERTUSSIS 5-2.5-18.5 LF-MCG/0.5 IM SUSP
0.5000 mL | Freq: Once | INTRAMUSCULAR | Status: AC
  Administered 2017-12-17: 0.5 mL via INTRAMUSCULAR
  Filled 2017-12-17: qty 0.5

## 2017-12-17 NOTE — ED Notes (Signed)
ED Provider at bedside. 

## 2017-12-17 NOTE — ED Notes (Signed)
Contacted [facility] for tx back to [facility]

## 2017-12-17 NOTE — ED Notes (Signed)
Pt. Return from CT via stretcher. 

## 2017-12-17 NOTE — ED Notes (Signed)
Pt. To CT via stretcher. 

## 2017-12-17 NOTE — ED Provider Notes (Signed)
[facility] EMERGENCY DEPARTMENT Provider Note   CSN: [id number] Arrival date & time: 12/17/17  2032     History   Chief Complaint Chief Complaint  Patient presents with  . Fall    HPI Billy Moody is a 84 y.o. male.  HPI Patient is a 84 year old male with a past medical history of previous CVA, previous MI, hyperlipidemia, and hypertension who presents to the emergency department for evaluation following a fall.  Patient had a mechanical fall in his bathroom earlier this evening.  States that he attempted to get up but had a mechanical fall backward striking the posterior aspect of his head.  He did not have loss of consciousness at the time of the fall.  He did suffer a laceration to his posterior head and as result his rehab center called EMS for transport to the emergency department.  Patient's bleeding was able to be controlled by EMS with direct pressure over the area.  Patient is currently on Plavix but no other anticoagulation.  On arrival to the emergency department patient continues to be hemodynamically stable with his only complaint being pain around the laceration site.  Denies any symptoms leading up to the fall including no chest pain, shortness of breath, or lightheadedness.  Reports full recollection of all the events leading up to and immediately following the fall.  Remaining review of systems as below.  Past Medical History:  Diagnosis Date  . Arthritis   . Cataract   . CVA (cerebral vascular accident) ([place])    X 2   . H/O total knee replacement   . Heart attack ([place])   . Heart disease   . High cholesterol   . Hypertension   . Irregular heart rhythm   . Macular degeneration   . Prostate cancer ([place])     There are no active problems to display for this patient.   Past Surgical History:  Procedure Laterality Date  . CATARACT EXTRACTION  2000   ou  . EYE SURGERY          Home Medications    Prior to Admission medications     Medication Sig Start Date End Date Taking? Authorizing Provider  acetaminophen (TYLENOL) 500 MG tablet Take 500 mg by mouth every 6 (six) hours as needed.    [provider]  aspirin (ASPIRIN EC) 81 MG EC tablet Take 1 tablet (81 mg total) by mouth daily. Swallow whole. 10/07/13   Fransico Meadow, PA-C  clopidogrel (PLAVIX) 75 MG tablet Take 75 mg by mouth daily.    [provider]  docusate sodium (COLACE) 100 MG capsule Take 100 mg by mouth 2 (two) times daily.    [provider]  lisinopril-hydrochlorothiazide (PRINZIDE,ZESTORETIC) 10-12.5 MG per tablet Take 1 tablet by mouth daily.    [provider]  metoprolol succinate (TOPROL-XL) 25 MG 24 hr tablet Take 25 mg by mouth daily.    [provider]  Multiple Vitamins-Minerals (CENTRUM SILVER PO) Take by mouth.    [provider]  Multiple Vitamins-Minerals (MULTIVITAMIN ADULT PO) Take by mouth.    [provider]  omega-3 fish oil (MAXEPA) 1000 MG CAPS capsule Take by mouth.    [provider]  pantoprazole (PROTONIX) 40 MG tablet Take 40 mg by mouth daily.    [provider]  polyethylene glycol (MIRALAX / GLYCOLAX) packet Take 17 g by mouth daily.    [provider]  simvastatin (ZOCOR) 40 MG tablet Take 40  mg by mouth daily.    [provider]    Family History Family History  Problem Relation Age of Onset  . Stroke Mother   . Cataracts Brother     Social History Social History   Tobacco Use  . Smoking status: Never Smoker  . Smokeless tobacco: Never Used  Substance Use Topics  . Alcohol use: Yes  . Drug use: No     Allergies   Percocet [oxycodone-acetaminophen]   Review of Systems Review of Systems  Constitutional: Negative for chills and fever.  HENT: Negative for ear pain and sore throat.   Eyes: Negative for pain and visual disturbance.  Respiratory: Negative for cough and shortness of breath.   Cardiovascular:  Negative for chest pain and palpitations.  Gastrointestinal: Negative for abdominal pain and vomiting.  Genitourinary: Negative for dysuria and hematuria.  Musculoskeletal: Negative for arthralgias and back pain.  Skin: Positive for wound. Negative for color change and rash.  Neurological: Negative for seizures, syncope, light-headedness and headaches.  All other systems reviewed and are negative.    Physical Exam Updated Vital Signs BP (!) 151/79 (BP Location: Right Arm)   Pulse 76   Temp 98 F (36.7 C) (Oral)   Resp 18   SpO2 98%   Physical Exam  Constitutional: He is oriented to person, place, and time. He appears well-developed and well-nourished.  HENT:  Head: Normocephalic and atraumatic.  Patient with a 3.5 cm laceration to the posterior aspect of his scalp.  Hemostatic at time of arrival to the emergency department.  Eyes: Pupils are equal, round, and reactive to light. Conjunctivae are normal.  Neck: Neck supple.  Cardiovascular: Normal rate and regular rhythm.  Pulmonary/Chest: Effort normal and breath sounds normal. No respiratory distress.  Abdominal: Soft. There is no tenderness.  Musculoskeletal: He exhibits no edema.  Neurological: He is alert and oriented to person, place, and time. No cranial nerve deficit. He exhibits normal muscle tone.  Normal strength in all 4 extremities.  No sensory deficits.  Skin: Skin is warm and dry.  Psychiatric: He has a normal mood and affect.  Nursing note and vitals reviewed.    ED Treatments / Results  Labs (all labs ordered are listed, but only abnormal results are displayed) Labs Reviewed - No data to display  EKG None  Radiology Ct Head Wo Contrast  Result Date: 12/17/2017 CLINICAL DATA:  Patient fell in the bathroom and hit back of head. EXAM: CT HEAD WITHOUT CONTRAST CT CERVICAL SPINE WITHOUT CONTRAST TECHNIQUE: Multidetector CT imaging of the head and cervical spine was performed following the standard protocol  without intravenous contrast. Multiplanar CT image reconstructions of the cervical spine were also generated. COMPARISON:  Head CT 12/28/2016 FINDINGS: CT HEAD FINDINGS Brain: There is no evidence for acute hemorrhage, hydrocephalus, mass lesion, or abnormal extra-axial fluid collection. No definite CT evidence for acute infarction. Diffuse loss of parenchymal volume is consistent with atrophy. Patchy low attenuation in the deep hemispheric and periventricular white matter is nonspecific, but likely reflects chronic microvascular ischemic demyelination. Vascular: No hyperdense vessel or unexpected calcification. Skull: No evidence for fracture. No worrisome lytic or sclerotic lesion. Sinuses/Orbits: The visualized paranasal sinuses and mastoid air cells are clear. Visualized portions of the globes and intraorbital fat are unremarkable. Other: None. CT CERVICAL SPINE FINDINGS Alignment: Normal. Skull base and vertebrae: No acute fracture. No primary bone lesion or focal pathologic process. Soft tissues and spinal canal: No prevertebral fluid or swelling. No visible canal hematoma.  Disc levels: Advanced degenerative disc disease noted at C3-4, C4-5, C5-6, and C6-7. Advanced facet osteoarthritis noted on the left at multiple levels with fusion of the left C2-3 facets and left C5-6 facets. Upper chest: Biapical pleuroparenchymal scarring. Other: None. IMPRESSION: 1. No acute intracranial abnormality. Atrophy with chronic small-vessel white matter ischemic disease. 2. Advanced degenerative changes in the mid and lower cervical spine without fracture. Electronically Signed   By: Misty Stanley M.D.   On: 12/17/2017 21:42   Ct Cervical Spine Wo Contrast  Result Date: 12/17/2017 CLINICAL DATA:  Patient fell in the bathroom and hit back of head. EXAM: CT HEAD WITHOUT CONTRAST CT CERVICAL SPINE WITHOUT CONTRAST TECHNIQUE: Multidetector CT imaging of the head and cervical spine was performed following the standard protocol  without intravenous contrast. Multiplanar CT image reconstructions of the cervical spine were also generated. COMPARISON:  Head CT 12/28/2016 FINDINGS: CT HEAD FINDINGS Brain: There is no evidence for acute hemorrhage, hydrocephalus, mass lesion, or abnormal extra-axial fluid collection. No definite CT evidence for acute infarction. Diffuse loss of parenchymal volume is consistent with atrophy. Patchy low attenuation in the deep hemispheric and periventricular white matter is nonspecific, but likely reflects chronic microvascular ischemic demyelination. Vascular: No hyperdense vessel or unexpected calcification. Skull: No evidence for fracture. No worrisome lytic or sclerotic lesion. Sinuses/Orbits: The visualized paranasal sinuses and mastoid air cells are clear. Visualized portions of the globes and intraorbital fat are unremarkable. Other: None. CT CERVICAL SPINE FINDINGS Alignment: Normal. Skull base and vertebrae: No acute fracture. No primary bone lesion or focal pathologic process. Soft tissues and spinal canal: No prevertebral fluid or swelling. No visible canal hematoma. Disc levels: Advanced degenerative disc disease noted at C3-4, C4-5, C5-6, and C6-7. Advanced facet osteoarthritis noted on the left at multiple levels with fusion of the left C2-3 facets and left C5-6 facets. Upper chest: Biapical pleuroparenchymal scarring. Other: None. IMPRESSION: 1. No acute intracranial abnormality. Atrophy with chronic small-vessel white matter ischemic disease. 2. Advanced degenerative changes in the mid and lower cervical spine without fracture. Electronically Signed   By: Misty Stanley M.D.   On: 12/17/2017 21:42    Procedures .Marland KitchenLaceration Repair Date/Time: 12/17/2017 9:16 PM Performed by: Melina Copa, MD Authorized by: Melina Copa, MD   Consent:    Consent obtained:  Verbal   Consent given by:  Patient   Risks discussed:  Infection, need for additional repair and poor wound  healing Laceration details:    Location:  Scalp   Scalp location:  L parietal   Length (cm):  3.5 Repair type:    Repair type:  Simple Exploration:    Hemostasis achieved with:  Direct pressure   Wound exploration: entire depth of wound probed and visualized     Wound extent: no foreign bodies/material noted     Contaminated: no   Treatment:    Area cleansed with:  Saline   Amount of cleaning:  Standard   Visualized foreign bodies/material removed: no   Skin repair:    Repair method:  Staples   Number of staples:  7 Approximation:    Approximation:  Close Post-procedure details:    Dressing:  Open (no dressing)   Patient tolerance of procedure:  Tolerated well, no immediate complications   (including critical care time)  Medications Ordered in ED Medications  lidocaine (PF) (XYLOCAINE) 1 % injection (5 mLs  Given by Other 12/17/17 2046)  Tdap (BOOSTRIX) injection 0.5 mL (0.5 mLs Intramuscular Given 12/17/17 2122)  Initial Impression / Assessment and Plan / ED Course  I have reviewed the triage vital signs and the nursing notes.  Pertinent labs & imaging results that were available during my care of the patient were reviewed by me and considered in my medical decision making (see chart for details).     Patient is a 84 year old male who presents emergency department following a mechanical fall.  Patient was attempting to get up in the bathroom at which time he fell backward striking the posterior aspect of his head.  Patient did suffer a 3.5 cm laceration to the left parietal aspect of his scalp.  Patient's laceration was repaired as detailed above.  Patient's tetanus status was updated.  Secondary to patient's age and fall head CT and CT C-spine were obtained.  These imaging studies show no evidence of acute traumatic injury of the cervical spine or intracranially.  As result of patient's reassuring imaging and reassuring neuro exam he is appropriate for discharge at this  time.  Patient will be sent back to his facility for further evaluation and care.  Patient instructed to follow-up with his primary care physician in the next few days for reevaluation.  Given instructions to have staples removed in 7 to 10 days.  Patient no acute distress at time of discharge.  The care of this patient was discussed with my attending physician Dr. Rex Kras, who voices agreement with work-up and ED disposition.  Final Clinical Impressions(s) / ED Diagnoses   Final diagnoses:  Laceration of scalp, initial encounter  Fall, initial encounter    ED Discharge Orders    None       Lavin Petteway, Chanda Busing, MD 12/18/17 0056    Rex Kras Wenda Overland, MD 12/19/17 [phone]

## 2017-12-17 NOTE — ED Notes (Signed)
Patient transported to CT 

## 2017-12-17 NOTE — ED Triage Notes (Signed)
Pt arrives via EMS from [facility] (rehab area). Pt sustained fall, hit his head, no LOC. Lac to the back of the head.  Pt was sitting on the toliet (he was able to get up after the fall).  Had 4 falls in the last 1.5 weeks. Recent dx with parkinsons. Pt is on PLAVIX. En route vitals 164/82, 74, 97% RA.

## 2018-02-02 DEATH — deceased

## 2018-03-05 DEATH — deceased
